# Patient Record
Sex: Female | Born: 1950 | Race: White | Hispanic: No | Smoking: Never smoker
Health system: Southern US, Community
[De-identification: ages and names within clinical notes are randomized; demographics above are authoritative.]

## PROBLEM LIST (undated history)

## (undated) DIAGNOSIS — IMO0002 Reserved for concepts with insufficient information to code with codable children: Secondary | ICD-10-CM

## (undated) DIAGNOSIS — M329 Systemic lupus erythematosus, unspecified: Secondary | ICD-10-CM

## (undated) DIAGNOSIS — E785 Hyperlipidemia, unspecified: Secondary | ICD-10-CM

## (undated) DIAGNOSIS — E0590 Thyrotoxicosis, unspecified without thyrotoxic crisis or storm: Secondary | ICD-10-CM

## (undated) DIAGNOSIS — M797 Fibromyalgia: Secondary | ICD-10-CM

## (undated) DIAGNOSIS — E669 Obesity, unspecified: Secondary | ICD-10-CM

## (undated) DIAGNOSIS — M1990 Unspecified osteoarthritis, unspecified site: Secondary | ICD-10-CM

## (undated) DIAGNOSIS — E559 Vitamin D deficiency, unspecified: Secondary | ICD-10-CM

## (undated) DIAGNOSIS — G894 Chronic pain syndrome: Secondary | ICD-10-CM

## (undated) HISTORY — DX: Thyrotoxicosis, unspecified without thyrotoxic crisis or storm: E05.90

## (undated) HISTORY — PX: CHOLECYSTECTOMY: SHX55

## (undated) HISTORY — DX: Reserved for concepts with insufficient information to code with codable children: IMO0002

## (undated) HISTORY — DX: Vitamin D deficiency, unspecified: E55.9

## (undated) HISTORY — DX: Hyperlipidemia, unspecified: E78.5

## (undated) HISTORY — DX: Chronic pain syndrome: G89.4

## (undated) HISTORY — DX: Obesity, unspecified: E66.9

## (undated) HISTORY — PX: TONSILLECTOMY: SUR1361

## (undated) HISTORY — DX: Unspecified osteoarthritis, unspecified site: M19.90

## (undated) HISTORY — DX: Fibromyalgia: M79.7

## (undated) HISTORY — DX: Systemic lupus erythematosus, unspecified: M32.9

## (undated) HISTORY — PX: OTHER SURGICAL HISTORY: SHX169

---

## 2015-09-30 LAB — LIPID PANEL
Cholesterol: 192 mg/dL (ref 0–200)
HDL: 70 mg/dL (ref 35–70)
LDL Cholesterol: 105 mg/dL
Triglycerides: 84 mg/dL (ref 40–160)

## 2015-09-30 LAB — TSH: TSH: 0.1 u[IU]/mL — AB (ref <=5.90)

## 2015-12-08 ENCOUNTER — Encounter: Payer: Self-pay | Admitting: "Endocrinology

## 2015-12-08 ENCOUNTER — Ambulatory Visit (INDEPENDENT_AMBULATORY_CARE_PROVIDER_SITE_OTHER): Payer: Medicare HMO | Admitting: "Endocrinology

## 2015-12-08 VITALS — BP 141/81 | HR 98 | Ht 63.0 in | Wt 264.0 lb

## 2015-12-08 DIAGNOSIS — E0590 Thyrotoxicosis, unspecified without thyrotoxic crisis or storm: Secondary | ICD-10-CM

## 2015-12-08 LAB — TSH: TSH: 17.88 mIU/L — ABNORMAL HIGH

## 2015-12-08 LAB — T3, FREE: T3 FREE: 2.5 pg/mL (ref 2.3–4.2)

## 2015-12-08 LAB — T4, FREE: FREE T4: 0.6 ng/dL — AB (ref 0.8–1.8)
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
## 2015-12-09 LAB — THYROGLOBULIN ANTIBODY: Thyroglobulin Ab: 3 IU/mL — ABNORMAL HIGH (ref <2)

## 2015-12-09 LAB — THYROID PEROXIDASE ANTIBODY: Thyroperoxidase Ab SerPl-aCnc: 729 IU/mL — ABNORMAL HIGH (ref <9)

## 2015-12-16 ENCOUNTER — Ambulatory Visit (INDEPENDENT_AMBULATORY_CARE_PROVIDER_SITE_OTHER): Payer: Medicare HMO | Admitting: "Endocrinology

## 2015-12-16 ENCOUNTER — Encounter: Payer: Self-pay | Admitting: "Endocrinology

## 2015-12-16 VITALS — BP 122/78 | HR 98 | Resp 18 | Ht 63.0 in | Wt 263.0 lb

## 2015-12-16 DIAGNOSIS — E039 Hypothyroidism, unspecified: Secondary | ICD-10-CM | POA: Insufficient documentation

## 2015-12-16 DIAGNOSIS — E038 Other specified hypothyroidism: Secondary | ICD-10-CM | POA: Diagnosis not present

## 2015-12-16 MED ORDER — LEVOTHYROXINE SODIUM 75 MCG PO TABS: 75.0000 ug | ORAL_TABLET | Freq: Every day | ORAL | 2 refills | Status: DC
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
## 2016-03-14 ENCOUNTER — Other Ambulatory Visit: Payer: Self-pay | Admitting: "Endocrinology

## 2016-03-15 LAB — T4, FREE: Free T4: 1 ng/dL (ref 0.8–1.8)

## 2016-03-15 LAB — TSH: TSH: 3.88 mIU/L

## 2016-03-21 ENCOUNTER — Encounter: Payer: Self-pay | Admitting: "Endocrinology

## 2016-03-21 ENCOUNTER — Ambulatory Visit (INDEPENDENT_AMBULATORY_CARE_PROVIDER_SITE_OTHER): Payer: Medicare HMO | Admitting: "Endocrinology

## 2016-03-21 VITALS — BP 118/83 | HR 108 | Ht 63.0 in | Wt 251.0 lb

## 2016-03-21 DIAGNOSIS — E038 Other specified hypothyroidism: Secondary | ICD-10-CM | POA: Diagnosis not present

## 2016-03-21 MED ORDER — LEVOTHYROXINE SODIUM 100 MCG PO TABS: 100.0000 ug | ORAL_TABLET | Freq: Every day | ORAL | 6 refills | Status: DC
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
## 2016-09-12 ENCOUNTER — Other Ambulatory Visit: Payer: Self-pay | Admitting: "Endocrinology

## 2016-09-12 LAB — T4, FREE: FREE T4: 1.2 ng/dL (ref 0.8–1.8)

## 2016-09-12 LAB — TSH: TSH: 0.84 m[IU]/L

## 2016-09-19 ENCOUNTER — Ambulatory Visit (INDEPENDENT_AMBULATORY_CARE_PROVIDER_SITE_OTHER): Payer: Medicare HMO | Admitting: "Endocrinology

## 2016-09-19 ENCOUNTER — Encounter: Payer: Self-pay | Admitting: "Endocrinology

## 2016-09-19 VITALS — BP 138/82 | HR 80 | Ht 63.0 in | Wt 266.0 lb

## 2016-09-19 DIAGNOSIS — E063 Autoimmune thyroiditis: Secondary | ICD-10-CM | POA: Diagnosis not present

## 2016-09-19 DIAGNOSIS — E038 Other specified hypothyroidism: Secondary | ICD-10-CM | POA: Diagnosis not present

## 2016-09-19 MED ORDER — LEVOTHYROXINE SODIUM 100 MCG PO TABS: 100.0000 ug | ORAL_TABLET | Freq: Every day | ORAL | 6 refills | Status: DC
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
## 2017-03-14 LAB — TSH: TSH: 1.1 mIU/L (ref 0.40–4.50)

## 2017-03-14 LAB — T4, FREE: FREE T4: 1.2 ng/dL (ref 0.8–1.8)

## 2017-03-21 ENCOUNTER — Encounter: Payer: Self-pay | Admitting: "Endocrinology

## 2017-03-21 ENCOUNTER — Ambulatory Visit (INDEPENDENT_AMBULATORY_CARE_PROVIDER_SITE_OTHER): Payer: Medicare Other | Admitting: "Endocrinology

## 2017-03-21 VITALS — BP 132/86 | HR 83 | Ht 63.0 in | Wt 281.0 lb

## 2017-03-21 DIAGNOSIS — E038 Other specified hypothyroidism: Secondary | ICD-10-CM

## 2017-03-21 DIAGNOSIS — E063 Autoimmune thyroiditis: Secondary | ICD-10-CM | POA: Insufficient documentation

## 2017-03-21 MED ORDER — LEVOTHYROXINE SODIUM 112 MCG PO TABS: 112.0000 ug | ORAL_TABLET | Freq: Every day | ORAL | 6 refills | Status: DC
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
## 2017-09-12 LAB — T4, FREE: FREE T4: 1.3 ng/dL (ref 0.8–1.8)

## 2017-09-12 LAB — TSH: TSH: 1.37 mIU/L (ref 0.40–4.50)

## 2017-09-19 ENCOUNTER — Ambulatory Visit (INDEPENDENT_AMBULATORY_CARE_PROVIDER_SITE_OTHER): Payer: Medicare Other | Admitting: "Endocrinology

## 2017-09-19 ENCOUNTER — Encounter: Payer: Self-pay | Admitting: "Endocrinology

## 2017-09-19 VITALS — BP 132/80 | HR 96 | Ht 63.0 in | Wt 276.0 lb

## 2017-09-19 DIAGNOSIS — E063 Autoimmune thyroiditis: Secondary | ICD-10-CM

## 2017-09-19 DIAGNOSIS — E038 Other specified hypothyroidism: Secondary | ICD-10-CM

## 2017-09-19 MED ORDER — LEVOTHYROXINE SODIUM 125 MCG PO TABS
125.0000 ug | ORAL_TABLET | Freq: Every day | ORAL | 6 refills | Status: DC
Start: 2017-09-19 — End: 2018-03-21
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
## 2018-03-14 LAB — T4, FREE: FREE T4: 1.2 ng/dL (ref 0.8–1.8)

## 2018-03-14 LAB — TSH: TSH: 2.04 m[IU]/L (ref 0.40–4.50)

## 2018-03-21 ENCOUNTER — Encounter: Payer: Self-pay | Admitting: "Endocrinology

## 2018-03-21 ENCOUNTER — Ambulatory Visit (INDEPENDENT_AMBULATORY_CARE_PROVIDER_SITE_OTHER): Payer: Medicare Other | Admitting: "Endocrinology

## 2018-03-21 VITALS — BP 126/85 | HR 101 | Ht 63.0 in | Wt 281.0 lb

## 2018-03-21 DIAGNOSIS — E038 Other specified hypothyroidism: Secondary | ICD-10-CM | POA: Diagnosis not present

## 2018-03-21 DIAGNOSIS — E063 Autoimmune thyroiditis: Secondary | ICD-10-CM | POA: Diagnosis not present

## 2018-03-21 MED ORDER — LEVOTHYROXINE SODIUM 137 MCG PO TABS: 137.0000 ug | ORAL_TABLET | Freq: Every day | ORAL | 6 refills | Status: DC
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
## 2018-09-12 LAB — TSH: TSH: 0.07 mIU/L — ABNORMAL LOW (ref 0.40–4.50)

## 2018-09-12 LAB — T4, FREE: Free T4: 1.3 ng/dL (ref 0.8–1.8)

## 2018-09-14 ENCOUNTER — Ambulatory Visit (HOSPITAL_COMMUNITY)
Admission: RE | Admit: 2018-09-14 | Discharge: 2018-09-14 | Disposition: A | Discharge status: Home / Self Care | Payer: Medicare Other | Source: Ambulatory Visit | Attending: "Endocrinology | Admitting: "Endocrinology

## 2018-09-14 ENCOUNTER — Other Ambulatory Visit: Payer: Self-pay

## 2018-09-14 DIAGNOSIS — E063 Autoimmune thyroiditis: Secondary | ICD-10-CM | POA: Insufficient documentation

## 2018-09-14 DIAGNOSIS — E038 Other specified hypothyroidism: Secondary | ICD-10-CM

## 2018-09-19 ENCOUNTER — Ambulatory Visit (INDEPENDENT_AMBULATORY_CARE_PROVIDER_SITE_OTHER): Payer: Medicare Other | Admitting: "Endocrinology

## 2018-09-19 ENCOUNTER — Encounter: Payer: Self-pay | Admitting: "Endocrinology

## 2018-09-19 ENCOUNTER — Other Ambulatory Visit: Payer: Self-pay

## 2018-09-19 DIAGNOSIS — E038 Other specified hypothyroidism: Secondary | ICD-10-CM

## 2018-09-19 DIAGNOSIS — E042 Nontoxic multinodular goiter: Secondary | ICD-10-CM

## 2018-09-19 DIAGNOSIS — E063 Autoimmune thyroiditis: Secondary | ICD-10-CM | POA: Diagnosis not present

## 2018-09-19 MED ORDER — LEVOTHYROXINE SODIUM 125 MCG PO TABS: 125.0000 ug | ORAL_TABLET | Freq: Every day | ORAL | 6 refills | Status: DC

## 2018-09-19 NOTE — Progress Notes (Signed)
09/19/2018                                Endocrinology Telehealth Visit Follow up Note -During COVID -19 Pandemic  I connected with April Blair on 09/19/2018   by telephone and verified that I am speaking with the correct person using two identifiers. April Blair, 1950-12-30. she has verbally consented to this visit. All issues noted in this document were discussed and addressed. The format was not optimal for physical exam.     Subjective:    Patient ID: April Blair, female    DOB: 07-05-1950, PCP April Jester, DO   Past Medical History:  Diagnosis Date  . Chronic pain disorder   . DJD (degenerative joint disease)   . Fibromyalgia   . Hyperlipidemia   . Hyperthyroidism   . Lupus (HCC)   . Obesity   . Vitamin D deficiency    Past Surgical History:  Procedure Laterality Date  . CESAREAN SECTION    . CHOLECYSTECTOMY    . OTHER SURGICAL HISTORY     R Ankle  . TONSILLECTOMY     Social History   Socioeconomic History  . Marital status: Divorced    Spouse name: Not on file  . Number of children: Not on file  . Years of education: Not on file  . Highest education level: Not on file  Occupational History  . Not on file  Social Needs  . Financial resource strain: Not on file  . Food insecurity:    Worry: Not on file    Inability: Not on file  . Transportation needs:    Medical: Not on file    Non-medical: Not on file  Tobacco Use  . Smoking status: Never Smoker  . Smokeless tobacco: Never Used  Substance and Sexual Activity  . Alcohol use: No  . Drug use: No  . Sexual activity: Not on file  Lifestyle  . Physical activity:    Days per week: Not on file    Minutes per session: Not on file  . Stress: Not on file  Relationships  . Social connections:    Talks on phone: Not on file    Gets together: Not on file    Attends religious service: Not on file    Active member of club or organization: Not on file    Attends meetings of clubs or  organizations: Not on file    Relationship status: Not on file  Other Topics Concern  . Not on file  Social History Narrative  . Not on file   Outpatient Encounter Medications as of 09/19/2018  Medication Sig  . buPROPion (WELLBUTRIN SR) 150 MG 12 hr tablet daily.  Marland Kitchen gabapentin (NEURONTIN) 400 MG capsule Take 400 mg by mouth 3 (three) times daily.  Marland Kitchen HYDROcodone-acetaminophen (NORCO) 10-325 MG tablet 4 (four) times daily.  Marland Kitchen levothyroxine (SYNTHROID) 125 MCG tablet Take 1 tablet (125 mcg total) by mouth daily before breakfast.  . linaclotide (LINZESS) 290 MCG CAPS capsule Take 290 mcg by mouth daily before breakfast.  . lisinopril (PRINIVIL,ZESTRIL) 10 MG tablet Take 10 mg by mouth daily.  . phentermine (ADIPEX-P) 37.5 MG tablet daily.  . traMADol (ULTRAM) 50 MG tablet Take by mouth every 6 (six) hours as needed.  . zolpidem (AMBIEN) 10 MG tablet Take 10 mg by mouth at bedtime.  . [DISCONTINUED] levothyroxine (SYNTHROID, LEVOTHROID) 137 MCG tablet Take 1 tablet (137 mcg  total) by mouth daily before breakfast.   No facility-administered encounter medications on file as of 09/19/2018.    ALLERGIES: No Known Allergies VACCINATION STATUS:  There is no immunization history on file for this patient.  HPI 68 year old female patient with medical history as above.   She is being engaged in telehealth in follow-up for hypothyroidism due to Hashimoto's thyroiditis. -She is currently on levothyroxine 137 mcg p.o. nightly. She reports compliance. She ports on and off palpitations, heat intolerance.    -She denies family history of thyroid dysfunction. She denies personal history of goiter. She denies family history of thyroid cancer. -She has 2 grown children none with thyroid problem. - Since her last visit, she was diagnosed with lupus. -Complains of on and off neck pain to the left side of the thyroid.  Her previsit thyroid ultrasound shows multinodular goiter.  Review of Systems Limited as  above.  Objective:    There were no vitals taken for this visit.  Wt Readings from Last 3 Encounters:  03/21/18 281 lb (127.5 kg)  09/19/17 276 lb (125.2 kg)  03/21/17 281 lb (127.5 kg)    Physical Exam    Recent Results (from the past 2160 hour(s))  TSH     Status: Abnormal   Collection Time: 09/12/18 11:20 AM  Result Value Ref Range   TSH 0.07 (L) 0.40 - 4.50 mIU/L  T4, free     Status: None   Collection Time: 09/12/18 11:20 AM  Result Value Ref Range   Free T4 1.3 0.8 - 1.8 ng/dL   September 14, 2018. Thyroid ultrasound showed 2 cm nodule on the isthmus with benign features and 1.4 cm nodule in the left lobe with benign features.  Assessment & Plan:   1. Hypothyroidism 2. Hashimoto's thyroiditis   - Her thyroid function tests are consistent with slight over replacement.  I discussed and lowered her levothyroxine to 125 mcg p.o. daily before breakfast.     - We discussed about the correct intake of her thyroid hormone, on empty stomach at fasting, with water, separated by at least 30 minutes from breakfast and other medications,  and separated by more than 4 hours from calcium, iron, multivitamins, acid reflux medications (PPIs). -Patient is made aware of the fact that thyroid hormone replacement is needed for life, dose to be adjusted by periodic monitoring of thyroid function tests.  I discussed the results of her thyroid/neck ultrasound from September 14, 2018.  She does have 2 nodules greater than 1.5 cm with benign features.  She does not require biopsy or intervention at this time.  However she will need another thyroid ultrasound in 1 year for follow-up.   - I advised patient to maintain close follow up with April JesterButler, Cynthia, DO for primary care needs.  Time for this visit 25 minutes. - Time spent with the patient: 25 min, of which >50% was spent in reviewing her  current and  previous labs/studies, previous treatments, and medications doses and developing a plan for  long-term care. April Blair participated in the discussions, expressed understanding, and voiced agreement with the above plans.  All questions were answered to her satisfaction. she is encouraged to contact clinic should she have any questions or concerns prior to her return visit.  Follow up plan: Return in about 6 months (around 03/21/2019) for Follow up with Pre-visit Labs.  Marquis LunchGebre Shaneca Orne, MD Phone: [phone]  Fax: [phone]  -  This note was partially dictated with voice recognition software. Similar sounding  words can be transcribed inadequately or may not  be corrected upon review.  09/19/2018, 4:09 PM

## 2019-03-15 LAB — TSH: TSH: 3.55 mIU/L (ref 0.40–4.50)

## 2019-03-15 LAB — T4, FREE: Free T4: 0.9 ng/dL (ref 0.8–1.8)

## 2019-03-21 ENCOUNTER — Other Ambulatory Visit: Payer: Self-pay

## 2019-03-21 ENCOUNTER — Encounter: Payer: Self-pay | Admitting: "Endocrinology

## 2019-03-21 ENCOUNTER — Ambulatory Visit (INDEPENDENT_AMBULATORY_CARE_PROVIDER_SITE_OTHER): Payer: Medicare Other | Admitting: "Endocrinology

## 2019-03-21 DIAGNOSIS — E063 Autoimmune thyroiditis: Secondary | ICD-10-CM

## 2019-03-21 DIAGNOSIS — E038 Other specified hypothyroidism: Secondary | ICD-10-CM

## 2019-03-21 MED ORDER — LEVOTHYROXINE SODIUM 137 MCG PO TABS: 137.0000 ug | ORAL_TABLET | Freq: Every day | ORAL | 4 refills | Status: DC

## 2019-03-21 NOTE — Progress Notes (Signed)
03/21/2019                                Endocrinology Telehealth Visit Follow up Note -During COVID -19 Pandemic  I connected with April Blair on 03/21/2019   by telephone and verified that I am speaking with the correct person using two identifiers. April Blair, [birth year]. she has verbally consented to this visit. All issues noted in this document were discussed and addressed. The format was not optimal for physical exam.     Subjective:    Patient ID: April Blair, female    DOB: 1950/11/20, PCP Hart Robinsons, DO   Past Medical History:  Diagnosis Date  . Chronic pain disorder   . DJD (degenerative joint disease)   . Fibromyalgia   . Hyperlipidemia   . Hyperthyroidism   . Lupus (HCC)   . Obesity   . Vitamin D deficiency    Past Surgical History:  Procedure Laterality Date  . CESAREAN SECTION    . CHOLECYSTECTOMY    . OTHER SURGICAL HISTORY     R Ankle  . TONSILLECTOMY     Social History   Socioeconomic History  . Marital status: Divorced    Spouse name: Not on file  . Number of children: Not on file  . Years of education: Not on file  . Highest education level: Not on file  Occupational History  . Not on file  Social Needs  . Financial resource strain: Not on file  . Food insecurity    Worry: Not on file    Inability: Not on file  . Transportation needs    Medical: Not on file    Non-medical: Not on file  Tobacco Use  . Smoking status: Never Smoker  . Smokeless tobacco: Never Used  Substance and Sexual Activity  . Alcohol use: No  . Drug use: No  . Sexual activity: Not on file  Lifestyle  . Physical activity    Days per week: Not on file    Minutes per session: Not on file  . Stress: Not on file  Relationships  . Social Musician on phone: Not on file    Gets together: Not on file    Attends religious service: Not on file    Active member of club or organization: Not on file    Attends meetings of clubs or  organizations: Not on file    Relationship status: Not on file  Other Topics Concern  . Not on file  Social History Narrative  . Not on file   Outpatient Encounter Medications as of 03/21/2019  Medication Sig  . buPROPion (WELLBUTRIN SR) 150 MG 12 hr tablet daily.  Marland Kitchen gabapentin (NEURONTIN) 400 MG capsule Take 400 mg by mouth 3 (three) times daily.  Marland Kitchen HYDROcodone-acetaminophen (NORCO) 10-325 MG tablet 4 (four) times daily.  Marland Kitchen levothyroxine (SYNTHROID) 137 MCG tablet Take 1 tablet (137 mcg total) by mouth daily before breakfast.  . linaclotide (LINZESS) 290 MCG CAPS capsule Take 290 mcg by mouth daily before breakfast.  . lisinopril (PRINIVIL,ZESTRIL) 10 MG tablet Take 10 mg by mouth daily.  . phentermine (ADIPEX-P) 37.5 MG tablet daily.  . traMADol (ULTRAM) 50 MG tablet Take by mouth every 6 (six) hours as needed.  . zolpidem (AMBIEN) 10 MG tablet Take 10 mg by mouth at bedtime.  . [DISCONTINUED] levothyroxine (SYNTHROID) 125 MCG tablet Take 1 tablet (125 mcg  total) by mouth daily before breakfast.   No facility-administered encounter medications on file as of 03/21/2019.    ALLERGIES: No Known Allergies VACCINATION STATUS:  There is no immunization history on file for this patient.  HPI 68 year old female patient with medical history as above.   She is being engaged in telehealth in follow-up for hypothyroidism due to Hashimoto's thyroiditis. -She is currently on levothyroxine 125 mcg p.o. nightly. She reports compliance. She has no new complaints today.  -She denies family history of thyroid dysfunction. She denies personal history of goiter. She denies family history of thyroid cancer. -She has 2 grown children none with thyroid problem. - Since her last visit, she was diagnosed with lupus. -Complains of on and off neck pain to the left side of the thyroid.  Her previsit thyroid ultrasound shows multinodular goiter.  Review of Systems Limited as above.  Objective:    There  were no vitals taken for this visit.  Wt Readings from Last 3 Encounters:  03/21/18 281 lb (127.5 kg)  09/19/17 276 lb (125.2 kg)  03/21/17 281 lb (127.5 kg)    Physical Exam    Recent Results (from the past 2160 hour(s))  T4, free     Status: None   Collection Time: 03/14/19  2:44 PM  Result Value Ref Range   Free T4 0.9 0.8 - 1.8 ng/dL  TSH     Status: None   Collection Time: 03/14/19  2:44 PM  Result Value Ref Range   TSH 3.55 0.40 - 4.50 mIU/L   September 14, 2018. Thyroid ultrasound showed 2 cm nodule on the isthmus with benign features and 1.4 cm nodule in the left lobe with benign features.  Assessment & Plan:   1. Hypothyroidism 2. Hashimoto's thyroiditis   - Her thyroid function tests are consistent with underreplacement.  I discussed and increased her levothyroxine to 137 mcg p.o. daily before breakfast.     - We discussed about the correct intake of her thyroid hormone, on empty stomach at fasting, with water, separated by at least 30 minutes from breakfast and other medications,  and separated by more than 4 hours from calcium, iron, multivitamins, acid reflux medications (PPIs). -Patient is made aware of the fact that thyroid hormone replacement is needed for life, dose to be adjusted by periodic monitoring of thyroid function tests.   I discussed the results of her thyroid/neck ultrasound from September 14, 2018.  She does have 2 nodules greater than 1.5 cm with benign features.  She does not require biopsy or intervention at this time.  However she will need another thyroid ultrasound in 1 year for follow-up.   - I advised patient to maintain close follow up with Scotty Court, DO for primary care needs.   Time for this visit: 15 minutes. [name]  participated in the discussions, expressed understanding, and voiced agreement with the above plans.  All questions were answered to her satisfaction. she is encouraged to contact clinic should she have any  questions or concerns prior to her return visit.   Follow up plan: Return in about 4 months (around 07/21/2019) for Follow up with Pre-visit Labs.  Glade Lloyd, MD Phone: [phone]  Fax: [phone]  -  This note was partially dictated with voice recognition software. Similar sounding words can be transcribed inadequately or may not  be corrected upon review.  03/21/2019, 7:37 PM

## 2019-04-16 ENCOUNTER — Other Ambulatory Visit: Payer: Self-pay

## 2019-04-16 MED ORDER — LEVOTHYROXINE SODIUM 137 MCG PO TABS: 137.0000 ug | ORAL_TABLET | Freq: Every day | ORAL | 4 refills | Status: DC

## 2019-05-02 ENCOUNTER — Other Ambulatory Visit (HOSPITAL_COMMUNITY): Payer: Self-pay | Admitting: Family

## 2019-05-07 ENCOUNTER — Other Ambulatory Visit (HOSPITAL_COMMUNITY): Payer: Self-pay | Admitting: Family

## 2019-05-07 DIAGNOSIS — N6489 Other specified disorders of breast: Secondary | ICD-10-CM

## 2019-05-14 ENCOUNTER — Other Ambulatory Visit: Payer: Self-pay

## 2019-05-14 ENCOUNTER — Ambulatory Visit (HOSPITAL_COMMUNITY)
Admission: RE | Admit: 2019-05-14 | Discharge: 2019-05-14 | Disposition: A | Discharge status: Home / Self Care | Payer: Medicare Other | Source: Ambulatory Visit | Attending: Family | Admitting: Family

## 2019-05-14 ENCOUNTER — Ambulatory Visit (HOSPITAL_COMMUNITY): Admission: RE | Admit: 2019-05-14 | Payer: Medicare Other | Source: Ambulatory Visit

## 2019-05-14 ENCOUNTER — Encounter (HOSPITAL_COMMUNITY): Payer: Self-pay

## 2019-05-14 DIAGNOSIS — N6489 Other specified disorders of breast: Secondary | ICD-10-CM | POA: Insufficient documentation

## 2019-06-18 ENCOUNTER — Encounter (HOSPITAL_COMMUNITY): Payer: Medicare Other

## 2019-06-18 ENCOUNTER — Other Ambulatory Visit (HOSPITAL_COMMUNITY): Payer: Self-pay | Admitting: Family

## 2019-06-18 ENCOUNTER — Ambulatory Visit (HOSPITAL_COMMUNITY)
Admission: RE | Admit: 2019-06-18 | Discharge: 2019-06-18 | Disposition: A | Discharge status: Home / Self Care | Payer: Medicare Other | Source: Ambulatory Visit | Attending: Family | Admitting: Family

## 2019-06-18 ENCOUNTER — Other Ambulatory Visit: Payer: Self-pay

## 2019-06-18 DIAGNOSIS — N6489 Other specified disorders of breast: Secondary | ICD-10-CM

## 2019-06-25 ENCOUNTER — Ambulatory Visit (HOSPITAL_COMMUNITY)
Admission: RE | Admit: 2019-06-25 | Discharge: 2019-06-25 | Disposition: A | Discharge status: Home / Self Care | Payer: Medicare Other | Source: Ambulatory Visit | Attending: Family | Admitting: Family

## 2019-06-25 ENCOUNTER — Other Ambulatory Visit: Payer: Self-pay

## 2019-06-25 ENCOUNTER — Ambulatory Visit (HOSPITAL_COMMUNITY)
Admission: RE | Admit: 2019-06-25 | Discharge: 2019-06-25 | Disposition: A | Discharge status: Home / Self Care | Payer: Medicare Other | Source: Ambulatory Visit | Attending: *Deleted | Admitting: *Deleted

## 2019-06-25 ENCOUNTER — Other Ambulatory Visit (HOSPITAL_COMMUNITY): Payer: Self-pay | Admitting: *Deleted

## 2019-06-25 DIAGNOSIS — R928 Other abnormal and inconclusive findings on diagnostic imaging of breast: Secondary | ICD-10-CM | POA: Diagnosis present

## 2019-06-25 DIAGNOSIS — N6489 Other specified disorders of breast: Secondary | ICD-10-CM | POA: Insufficient documentation

## 2019-06-25 MED ORDER — LIDOCAINE HCL (PF) 2 % IJ SOLN
INTRAMUSCULAR | Status: AC
  Filled 2019-06-25: qty 10

## 2019-06-25 MED ORDER — SODIUM BICARBONATE 4.2 % IV SOLN
INTRAVENOUS | Status: AC
  Filled 2019-06-25: qty 10

## 2019-06-25 MED ORDER — LIDOCAINE-EPINEPHRINE (PF) 1 %-1:200000 IJ SOLN
INTRAMUSCULAR | Status: AC
  Filled 2019-06-25: qty 30

## 2019-06-26 LAB — SURGICAL PATHOLOGY

## 2019-07-16 LAB — T4, FREE: Free T4: 1.2 ng/dL (ref 0.8–1.8)

## 2019-07-16 LAB — TSH: TSH: 0.05 mIU/L — ABNORMAL LOW (ref 0.40–4.50)

## 2019-07-23 ENCOUNTER — Encounter: Payer: Self-pay | Admitting: "Endocrinology

## 2019-07-23 ENCOUNTER — Ambulatory Visit (INDEPENDENT_AMBULATORY_CARE_PROVIDER_SITE_OTHER): Payer: Medicare Other | Admitting: "Endocrinology

## 2019-07-23 DIAGNOSIS — E042 Nontoxic multinodular goiter: Secondary | ICD-10-CM

## 2019-07-23 DIAGNOSIS — E038 Other specified hypothyroidism: Secondary | ICD-10-CM | POA: Diagnosis not present

## 2019-07-23 DIAGNOSIS — E063 Autoimmune thyroiditis: Secondary | ICD-10-CM | POA: Diagnosis not present

## 2019-07-23 MED ORDER — LEVOTHYROXINE SODIUM 125 MCG PO TABS: 125.0000 ug | ORAL_TABLET | Freq: Every day | ORAL | 3 refills | Status: DC

## 2019-07-23 NOTE — Progress Notes (Addendum)
07/23/2019                                          Endocrinology Telehealth Visit Follow up Note -During COVID -19 Pandemic  I connected with April Blair on 07/23/2019   by telephone and verified that I am speaking with the correct person using two identifiers. April Blair, 08-06-1950. she has verbally consented to this visit. All issues noted in this document were discussed and addressed. The format was not optimal for physical exam.   Subjective:    Patient ID: April Blair, female    DOB: [birth year], PCP Hart Robinsons, DO   Past Medical History:  Diagnosis Date  . Chronic pain disorder   . DJD (degenerative joint disease)   . Fibromyalgia   . Hyperlipidemia   . Hyperthyroidism   . Lupus (HCC)   . Obesity   . Vitamin D deficiency    Past Surgical History:  Procedure Laterality Date  . CESAREAN SECTION    . CHOLECYSTECTOMY    . OTHER SURGICAL HISTORY     R Ankle  . TONSILLECTOMY     Social History   Socioeconomic History  . Marital status: Divorced    Spouse name: Not on file  . Number of children: Not on file  . Years of education: Not on file  . Highest education level: Not on file  Occupational History  . Not on file  Tobacco Use  . Smoking status: Never Smoker  . Smokeless tobacco: Never Used  Substance and Sexual Activity  . Alcohol use: No  . Drug use: No  . Sexual activity: Not on file  Other Topics Concern  . Not on file  Social History Narrative  . Not on file   Social Determinants of Health   Financial Resource Strain:   . Difficulty of Paying Living Expenses: Not on file  Food Insecurity:   . Worried About Programme researcher, broadcasting/film/video in the Last Year: Not on file  . Ran Out of Food in the Last Year: Not on file  Transportation Needs:   . Lack of Transportation (Medical): Not on file  . Lack of Transportation (Non-Medical): Not on file  Physical Activity:   . Days of Exercise per Week: Not on file  . Minutes of Exercise per Session: Not  on file  Stress:   . Feeling of Stress : Not on file  Social Connections:   . Frequency of Communication with Friends and Family: Not on file  . Frequency of Social Gatherings with Friends and Family: Not on file  . Attends Religious Services: Not on file  . Active Member of Clubs or Organizations: Not on file  . Attends Banker Meetings: Not on file  . Marital Status: Not on file   Outpatient Encounter Medications as of 07/23/2019  Medication Sig  . buPROPion (WELLBUTRIN SR) 150 MG 12 hr tablet daily.  Marland Kitchen gabapentin (NEURONTIN) 400 MG capsule Take 400 mg by mouth 3 (three) times daily.  Marland Kitchen HYDROcodone-acetaminophen (NORCO) 10-325 MG tablet 4 (four) times daily.  Marland Kitchen levothyroxine (SYNTHROID) 125 MCG tablet Take 1 tablet (125 mcg total) by mouth daily before breakfast.  . linaclotide (LINZESS) 290 MCG CAPS capsule Take 290 mcg by mouth daily before breakfast.  . lisinopril (PRINIVIL,ZESTRIL) 10 MG tablet Take 10 mg by mouth daily.  . phentermine (ADIPEX-P) 37.5 MG tablet  daily.  . traMADol (ULTRAM) 50 MG tablet Take by mouth every 6 (six) hours as needed.  . zolpidem (AMBIEN) 10 MG tablet Take 10 mg by mouth at bedtime.  . [DISCONTINUED] levothyroxine (SYNTHROID) 137 MCG tablet Take 1 tablet (137 mcg total) by mouth daily before breakfast.   No facility-administered encounter medications on file as of 07/23/2019.   ALLERGIES: No Known Allergies VACCINATION STATUS:  There is no immunization history on file for this patient.  HPI 69 year old female patient with medical history as above.   She is being engaged in telehealth via telephone in follow-up for hypothyroidism due to Hashimoto's thyroiditis.  -She is currently on levothyroxine 137 mcg p.o. daily before breakfast.   she reports compliance. She has no new complaints today.  -She denies family history of thyroid dysfunction. She denies personal history of goiter. She denies family history of thyroid cancer. -She has 2  grown children none with thyroid problem. - Since her last visit, she was diagnosed with lupus. -Complains of on and off neck pain to the left side of the thyroid.  Her previsit thyroid ultrasound shows multinodular goiter.  Review of Systems Limited as above.  Objective:    There were no vitals taken for this visit.  Wt Readings from Last 3 Encounters:  03/21/18 281 lb (127.5 kg)  09/19/17 276 lb (125.2 kg)  03/21/17 281 lb (127.5 kg)    Physical Exam    Recent Results (from the past 2160 hour(s))  Surgical pathology     Status: None   Collection Time: 06/25/19  1:08 PM  Result Value Ref Range   SURGICAL PATHOLOGY      SURGICAL PATHOLOGY CASE: [id number] PATIENT: Olene Vigorito Surgical Pathology Report     Clinical History: mass - ? papilloma/FA. r/o malignancy     FINAL MICROSCOPIC DIAGNOSIS:  A. BREAST, 8:00, RIGHT, BIOPSY: - Benign lymph node.  See comment - No evidence of malignancy    COMMENT:  [facility] was notified on 06/26/2019.    GROSS DESCRIPTION:  The specimen is received in formalin labeled with the patient's name and right breast, and consists of 5 cores and core fragments of tan-yellow, hemorrhagic fibroadipose tissue ranging from 0.3 x 0.2 x 0.1 cm 1.4 x 0.2 x 0.1 cm.  The specimen is entirely submitted in 1 cassette. Time in formalin 1 PM on 06/25/2019.  Cold ischemic time less than 1 minute. Craig Staggers 06/26/2019)    Final Diagnosis performed by Jaquita Folds, MD.   Electronically signed 06/26/2019 Technical component performed at [facility] [address] [place], [place] [address].  Professional  component performed at [employer]. [facility], [place] [address], [place], [place] [address].  Immunohistochemistry Technical component (if applicable) was performed at [facility]. [address] [place], [place],  [address].   IMMUNOHISTOCHEMISTRY DISCLAIMER  (if applicable): Some of these immunohistochemical stains may have been developed and the performance characteristics determine by [facility]. Some may not have been cleared or approved by the U.S. Food and Drug Administration. The FDA has determined that such clearance or approval is not necessary. This test is used for clinical purposes. It should not be regarded as investigational or for research. This laboratory is certified under the [place] (CLIA-88) as qualified to perform high complexity clinical laboratory testing.  The controls stained appropriately.   T4, free     Status: None   Collection Time: 07/16/19  11:00 AM  Result Value Ref Range   Free T4 1.2 0.8 - 1.8 ng/dL  TSH     Status: Abnormal   Collection Time: 07/16/19 11:00 AM  Result Value Ref Range   TSH 0.05 (L) 0.40 - 4.50 mIU/L   September 14, 2018. Thyroid ultrasound showed 2 cm nodule on the isthmus with benign features and 1.4 cm nodule in the left lobe with benign features.  Assessment & Plan:   1. Hypothyroidism 2. Hashimoto's thyroiditis   - Her thyroid function tests are consistent with slight over replacement.  I discussed and lowered her levothyroxine to 125 mcg p.o. daily before breakfast.     - We discussed about the correct intake of her thyroid hormone, on empty stomach at fasting, with water, separated by at least 30 minutes from breakfast and other medications,  and separated by more than 4 hours from calcium, iron, multivitamins, acid reflux medications (PPIs). -Patient is made aware of the fact that thyroid hormone replacement is needed for life, dose to be adjusted by periodic monitoring of thyroid function tests.  I discussed the results of her thyroid/neck ultrasound from September 14, 2018.  She does have 2 nodules greater than 1.5 cm with benign features.  She does not require biopsy or intervention at this time.  However she will need another  thyroid ultrasound in 1 year for follow-up.   - I advised patient to maintain close follow up with Hart Robinsons, DO for primary care needs.     - Time spent on this patient care encounter:  20 minutes of which 50% was spent in  counseling and the rest reviewing  her current and  previous labs / studies and medications  doses and developing a plan for long term care. April Blair  participated in the discussions, expressed understanding, and voiced agreement with the above plans.  All questions were answered to her satisfaction. she is encouraged to contact clinic should she have any questions or concerns prior to her return visit.   Follow up plan: Return in about 3 months (around 10/23/2019) for Follow up with Pre-visit Labs.  Marquis Lunch, MD Phone: [phone]  Fax: [phone]  -  This note was partially dictated with voice recognition software. Similar sounding words can be transcribed inadequately or may not  be corrected upon review.  07/23/2019, 12:52 PM

## 2019-07-23 NOTE — Patient Instructions (Signed)

## 2019-10-18 LAB — TSH: TSH: 2.5 mIU/L (ref 0.40–4.50)

## 2019-10-18 LAB — T4, FREE: Free T4: 1.1 ng/dL (ref 0.8–1.8)

## 2019-10-24 ENCOUNTER — Other Ambulatory Visit: Payer: Self-pay

## 2019-10-24 ENCOUNTER — Ambulatory Visit (INDEPENDENT_AMBULATORY_CARE_PROVIDER_SITE_OTHER): Payer: Medicare Other | Admitting: "Endocrinology

## 2019-10-24 ENCOUNTER — Encounter: Payer: Self-pay | Admitting: "Endocrinology

## 2019-10-24 VITALS — BP 113/74 | HR 99 | Ht 63.0 in | Wt 306.2 lb

## 2019-10-24 DIAGNOSIS — E063 Autoimmune thyroiditis: Secondary | ICD-10-CM

## 2019-10-24 DIAGNOSIS — E038 Other specified hypothyroidism: Secondary | ICD-10-CM | POA: Diagnosis not present

## 2019-10-24 MED ORDER — LEVOTHYROXINE SODIUM 125 MCG PO TABS: 125.0000 ug | ORAL_TABLET | Freq: Every day | ORAL | 1 refills | Status: AC

## 2019-10-24 NOTE — Progress Notes (Signed)
10/24/2019              Endocrinology follow-up note  Subjective:    Patient ID: April Blair, female    DOB: 05-27-1950, PCP April Robinsons, DO   Past Medical History:  Diagnosis Date  . Chronic pain disorder   . DJD (degenerative joint disease)   . Fibromyalgia   . Hyperlipidemia   . Hyperthyroidism   . Lupus (HCC)   . Obesity   . Vitamin D deficiency    Past Surgical History:  Procedure Laterality Date  . CESAREAN SECTION    . CHOLECYSTECTOMY    . OTHER SURGICAL HISTORY     R Ankle  . TONSILLECTOMY     Social History   Socioeconomic History  . Marital status: Divorced    Spouse name: Not on file  . Number of children: Not on file  . Years of education: Not on file  . Highest education level: Not on file  Occupational History  . Not on file  Tobacco Use  . Smoking status: Never Smoker  . Smokeless tobacco: Never Used  Substance and Sexual Activity  . Alcohol use: No  . Drug use: No  . Sexual activity: Not on file  Other Topics Concern  . Not on file  Social History Narrative  . Not on file   Social Determinants of Health   Financial Resource Strain:   . Difficulty of Paying Living Expenses:   Food Insecurity:   . Worried About Programme researcher, broadcasting/film/video in the Last Year:   . Barista in the Last Year:   Transportation Needs:   . Freight forwarder (Medical):   Marland Kitchen Lack of Transportation (Non-Medical):   Physical Activity:   . Days of Exercise per Week:   . Minutes of Exercise per Session:   Stress:   . Feeling of Stress :   Social Connections:   . Frequency of Communication with Friends and Family:   . Frequency of Social Gatherings with Friends and Family:   . Attends Religious Services:   . Active Member of Clubs or Organizations:   . Attends Banker Meetings:   Marland Kitchen Marital Status:    Outpatient Encounter Medications as of 10/24/2019  Medication Sig  . buPROPion (WELLBUTRIN SR) 150 MG 12 hr tablet daily.  Marland Kitchen gabapentin  (NEURONTIN) 800 MG tablet Take 800 mg by mouth 4 (four) times daily.  Marland Kitchen HYDROcodone-acetaminophen (NORCO) 10-325 MG tablet 4 (four) times daily.  Marland Kitchen levothyroxine (SYNTHROID) 125 MCG tablet Take 1 tablet (125 mcg total) by mouth daily before breakfast.  . linaclotide (LINZESS) 290 MCG CAPS capsule Take 290 mcg by mouth daily before breakfast.  . lisinopril (PRINIVIL,ZESTRIL) 10 MG tablet Take 10 mg by mouth daily.  . phentermine (ADIPEX-P) 37.5 MG tablet daily.  . traMADol (ULTRAM) 50 MG tablet Take by mouth every 6 (six) hours as needed.  . zolpidem (AMBIEN) 10 MG tablet Take 10 mg by mouth at bedtime.  . [DISCONTINUED] gabapentin (NEURONTIN) 400 MG capsule Take 400 mg by mouth 3 (three) times daily.  . [DISCONTINUED] levothyroxine (SYNTHROID) 125 MCG tablet Take 1 tablet (125 mcg total) by mouth daily before breakfast.   No facility-administered encounter medications on file as of 10/24/2019.   ALLERGIES: No Known Allergies VACCINATION STATUS:  There is no immunization history on file for this patient.  HPI 69 year old female patient with medical history as above.   She is being seen in follow-up for management  of hypothyroidism due to Hashimoto's thyroiditis.   -She is currently on levothyroxine 125 mcg p.o. daily before breakfast.  She reports compliance with medication.  She has no new complaints.  She reports fluctuating body weight.   -She denies family history of thyroid dysfunction. She denies personal history of goiter. She denies family history of thyroid cancer. -She has 2 grown children none with thyroid problem. - Since her last visit, she was diagnosed with lupus. -Complains of on and off neck pain to the left side of the thyroid.  Her previsit thyroid ultrasound shows multinodular goiter.  Review of Systems Limited as above.  Objective:    BP 113/74   Pulse 99   Ht 5\' 3"  (1.6 m)   Wt (!) 306 lb 3.2 oz (138.9 kg)   BMI 54.24 kg/m   Wt Readings from Last 3 Encounters:   10/24/19 (!) 306 lb 3.2 oz (138.9 kg)  03/21/18 281 lb (127.5 kg)  09/19/17 276 lb (125.2 kg)    Physical Exam    Recent Results (from the past 2160 hour(s))  T4, free     Status: None   Collection Time: 10/17/19 12:12 PM  Result Value Ref Range   Free T4 1.1 0.8 - 1.8 ng/dL  TSH     Status: None   Collection Time: 10/17/19 12:12 PM  Result Value Ref Range   TSH 2.50 0.40 - 4.50 mIU/L   September 14, 2018. Thyroid ultrasound showed 2 cm nodule on the isthmus with benign features and 1.4 cm nodule in the left lobe with benign features.  Assessment & Plan:   1. Hypothyroidism 2. Hashimoto's thyroiditis 3.  Multinodular goiter   - Her thyroid function tests are consistent with appropriate replacement.  She is advised to continue levothyroxine 125 mg p.o. daily before breakfast.   - We discussed about the correct intake of her thyroid hormone, on empty stomach at fasting, with water, separated by at least 30 minutes from breakfast and other medications,  and separated by more than 4 hours from calcium, iron, multivitamins, acid reflux medications (PPIs). -Patient is made aware of the fact that thyroid hormone replacement is needed for life, dose to be adjusted by periodic monitoring of thyroid function tests.  I discussed the results of her thyroid/neck ultrasound from September 14, 2018.  She does have 2 nodules greater than 1.5 cm with benign features.  She does not require biopsy or intervention at this time.  However she will need another thyroid ultrasound in 1 year for follow-up.   - I advised patient to maintain close follow up with Scotty Court, DO for primary care needs.      - Time spent on this patient care encounter:  20 minutes of which 50% was spent in  counseling and the rest reviewing  her current and  previous labs / studies and medications  doses and developing a plan for long term care. [name]  participated in the discussions, expressed understanding, and  voiced agreement with the above plans.  All questions were answered to her satisfaction. she is encouraged to contact clinic should she have any questions or concerns prior to her return visit.    Follow up plan: Return in about 6 months (around 04/24/2020) for F/U with Pre-visit Labs.  Glade Lloyd, MD Phone: [phone]  Fax: [phone]  -  This note was partially dictated with voice recognition software. Similar sounding words can be transcribed inadequately or may not  be corrected upon review.  10/24/2019, 5:30 PM

## 2020-04-28 ENCOUNTER — Ambulatory Visit: Payer: Medicare Other | Admitting: Nurse Practitioner

## 2020-04-28 NOTE — Patient Instructions (Incomplete)

## 2021-08-04 IMAGING — MG MM BREAST LOCALIZATION CLIP
4 series · 4 of 12 positions shown · non-contrast
Comparison: Previous exam(s).

CLINICAL DATA: Post ultrasound-guided biopsy of a mass in the right
breast at the 8 o'clock position.

EXAM:
DIAGNOSTIC RIGHT MAMMOGRAM POST ULTRASOUND BIOPSY

[R ML synth-2D]
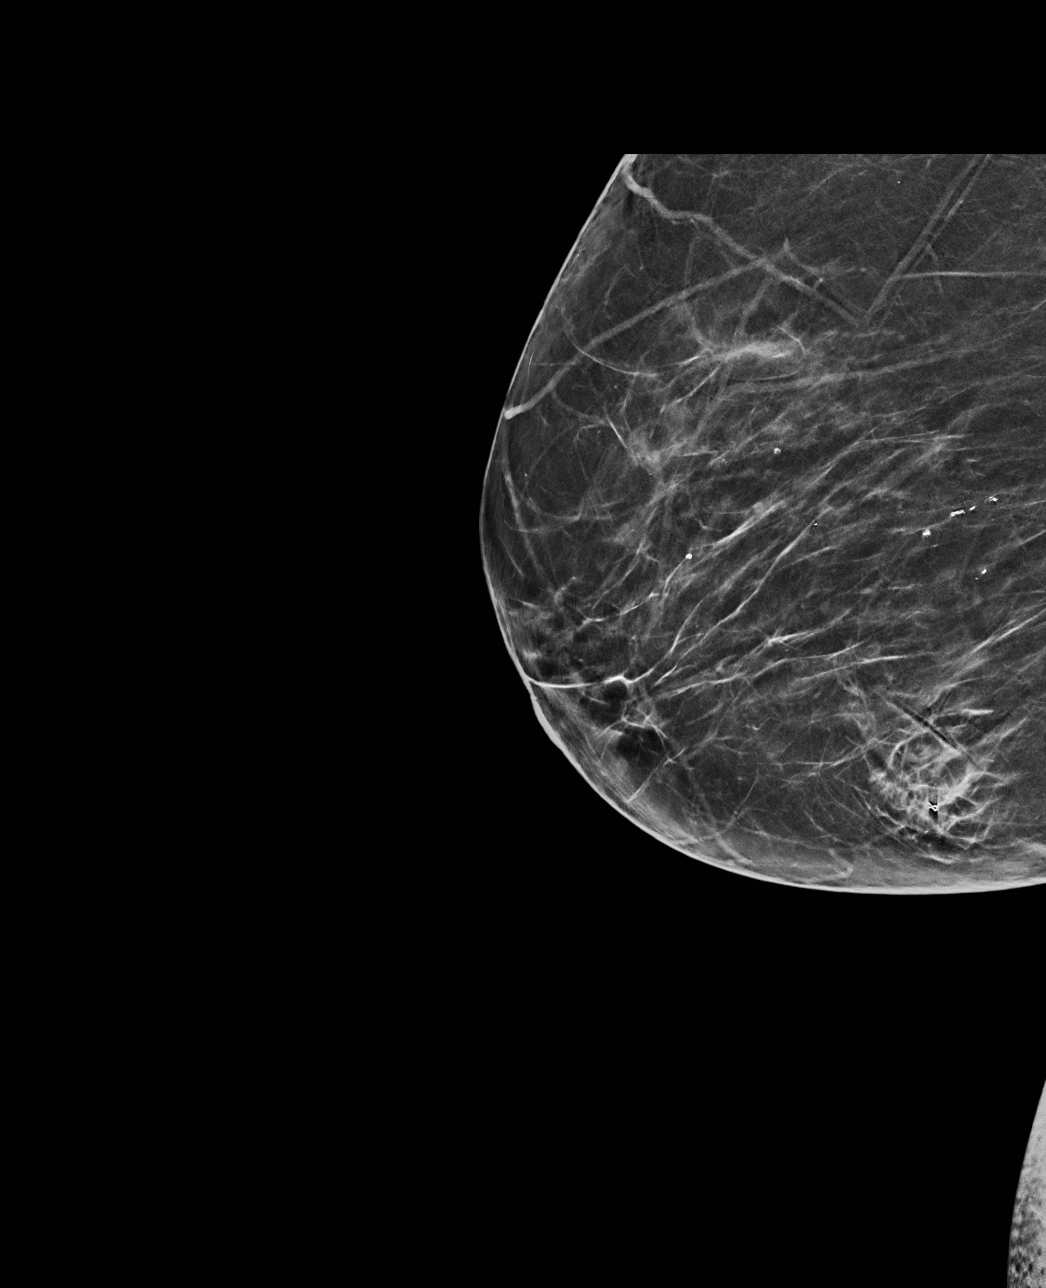

[R CC synth-2D]
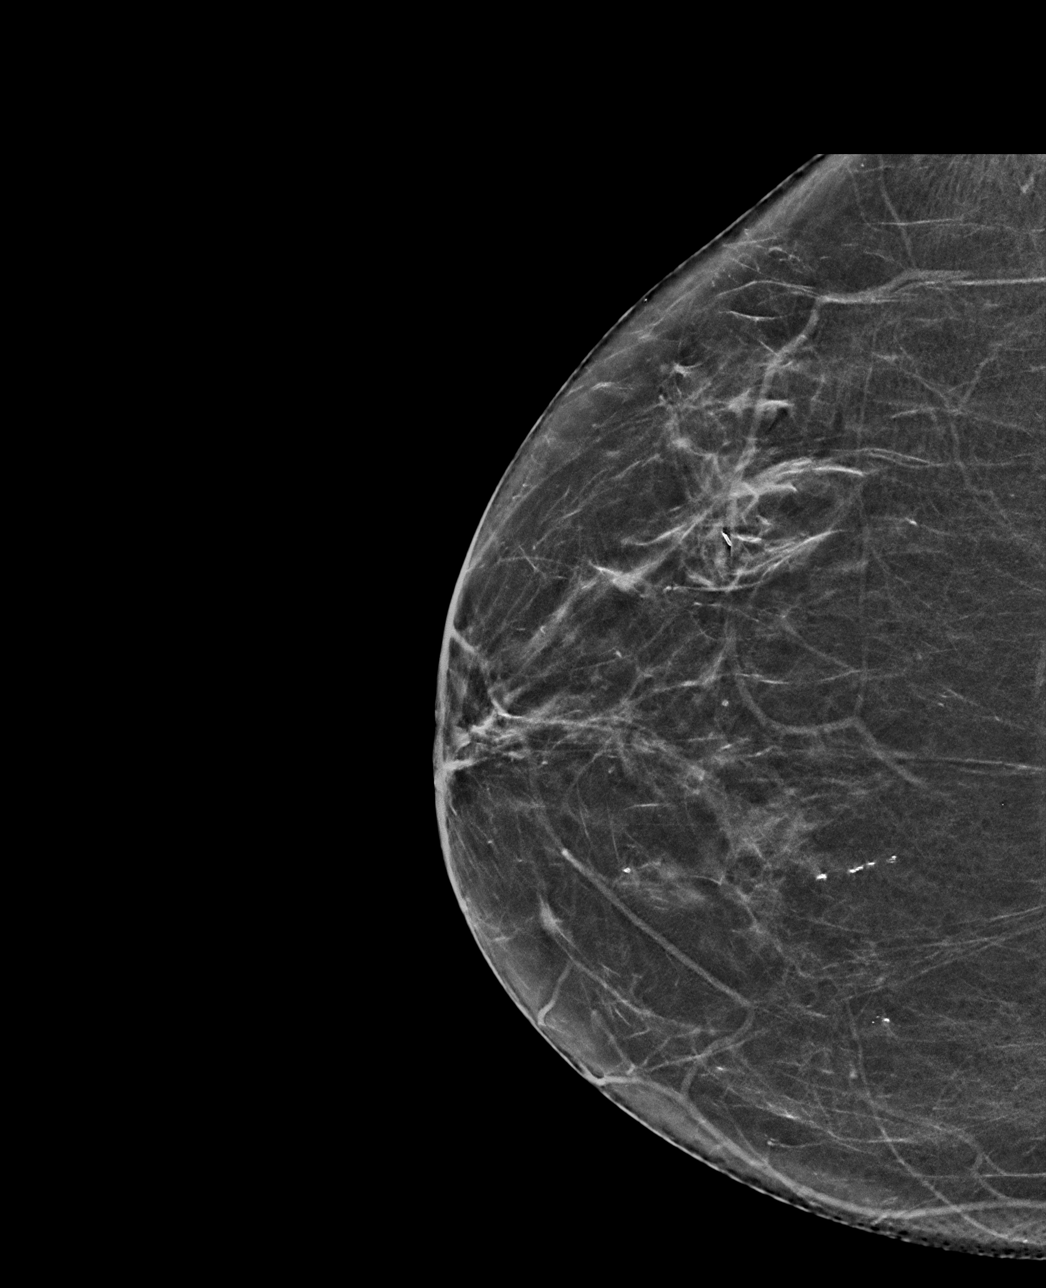

[R CC tomo · tomo slice 31/61.0]
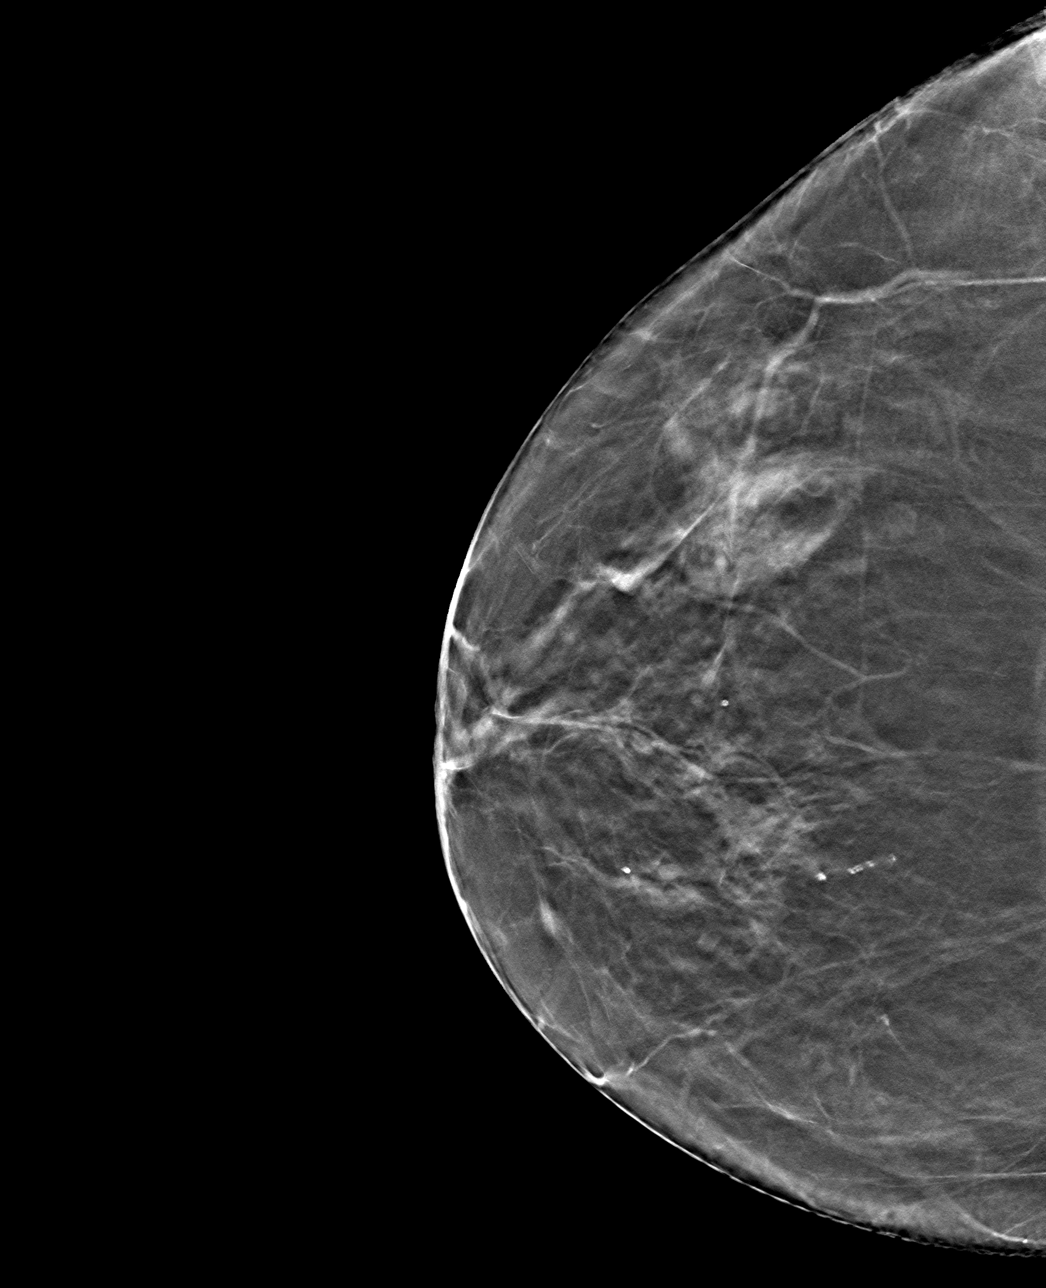

[R ML tomo · tomo slice 33/64.0]
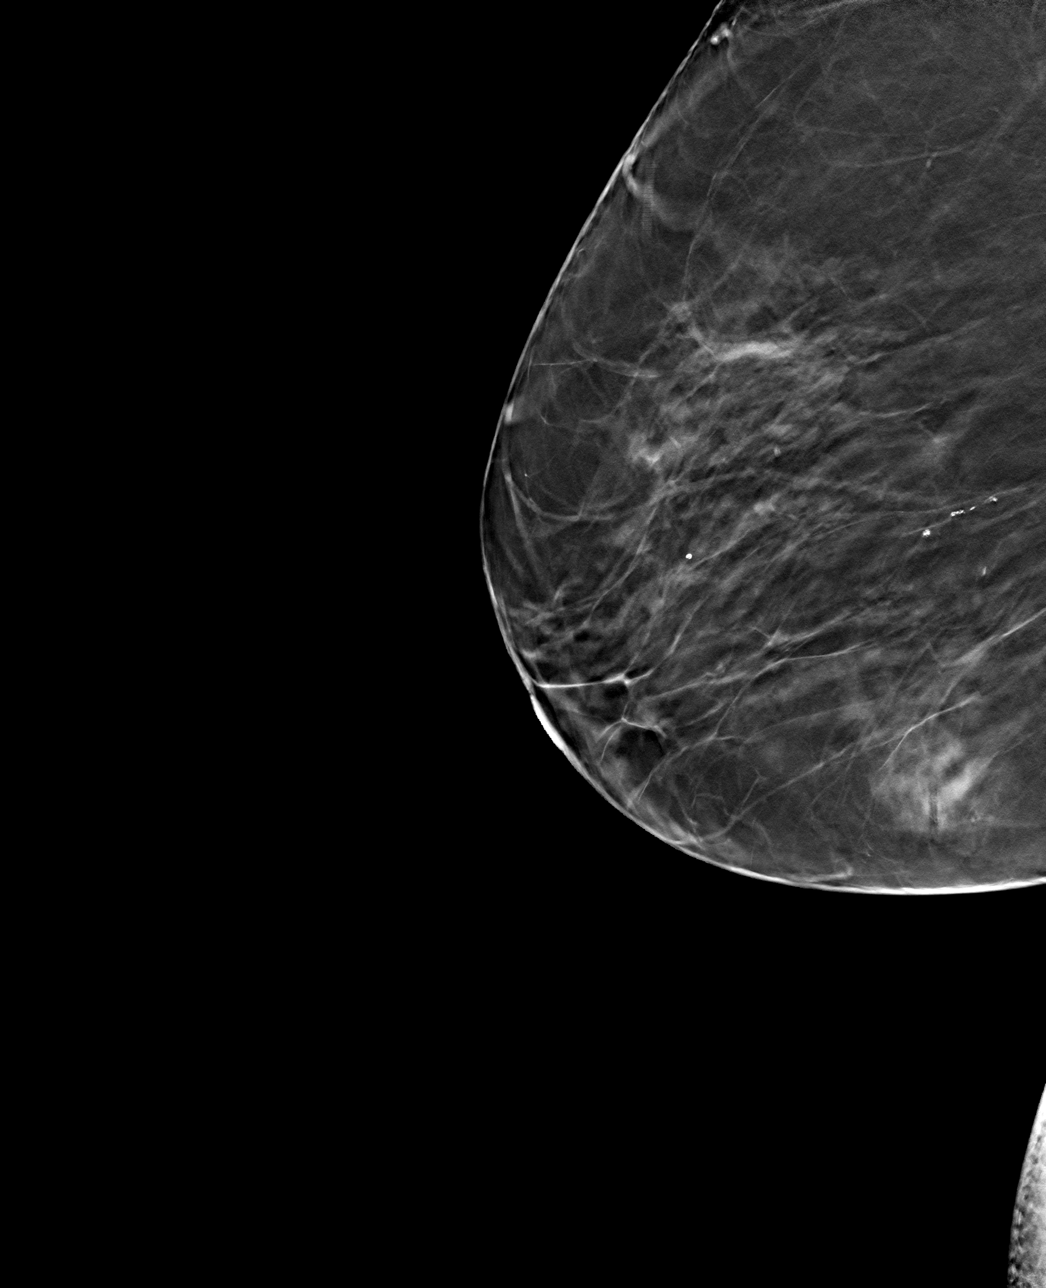

[4 of 12 positions shown; findings below may reference images not displayed]

FINDINGS: Mammographic images were obtained following ultrasound guided biopsy
of a mass in the right breast at the 8 position. The biopsy marking
clip is a ribbon shaped biopsy marking clip is present at the site
of the biopsied mass, although the mass cannot be identified on the
mammogram due to post biopsy hematoma this location.
IMPRESSION: Ribbon shaped biopsy marking clip at the site of the biopsied mass
in the right breast at the 8 o'clock position although the mass
cannot be adequately visualized due to post biopsy change in this
location.

Final Assessment: Post Procedure Mammograms for Marker Placement

## 2021-08-04 IMAGING — US US  BREAST BX W/ LOC DEV 1ST LESION IMG BX SPEC US GUIDE*R*
1 series · 12 of 23 positions shown · non-contrast
Comparison: Previous exam(s).
COMPARISON: Previous exam(s).

Addendum:
CLINICAL DATA: 60-year-old female with an indeterminate mass in the
right breast at the 8 o'clock position.

EXAM:
ULTRASOUND GUIDED RIGHT BREAST CORE NEEDLE BIOPSY

[Series 1: us breast bx w/ loc dev 1st lesion img bx spec us  · 0.07mm/px · 12 of 23 slices shown]
[im 1/23]
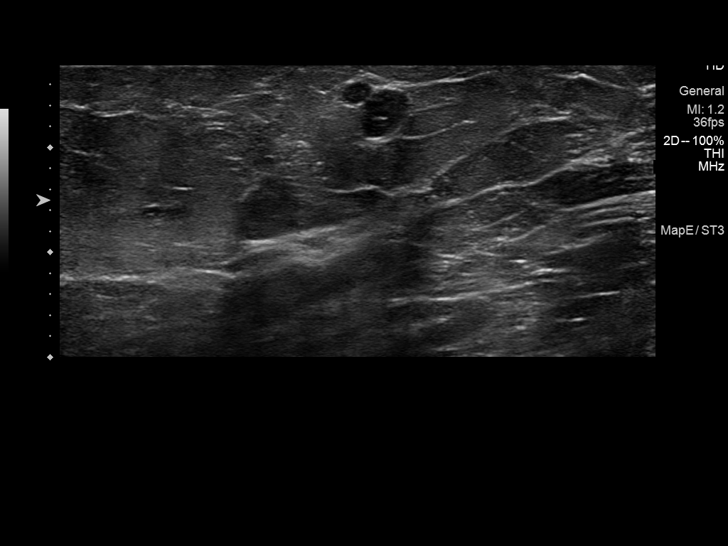
[im 3/23]
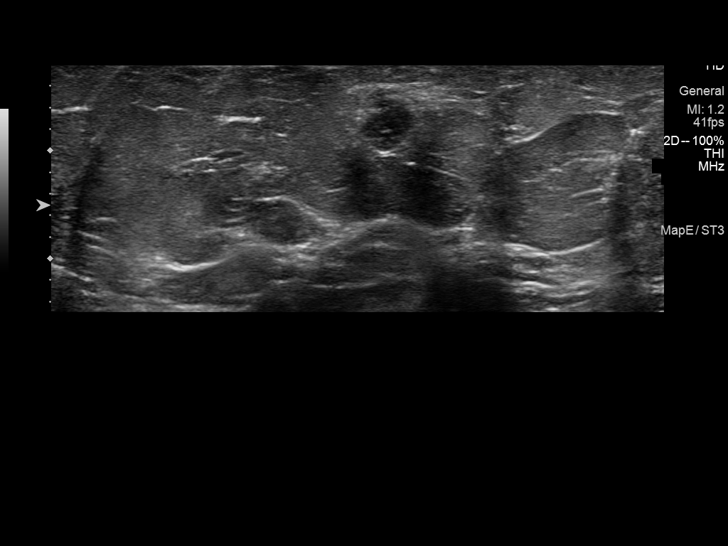
[im 5/23]
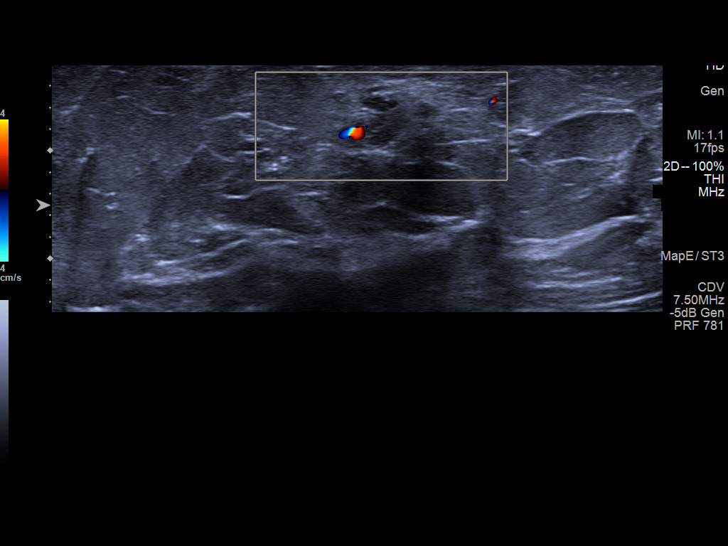
[im 7/23]
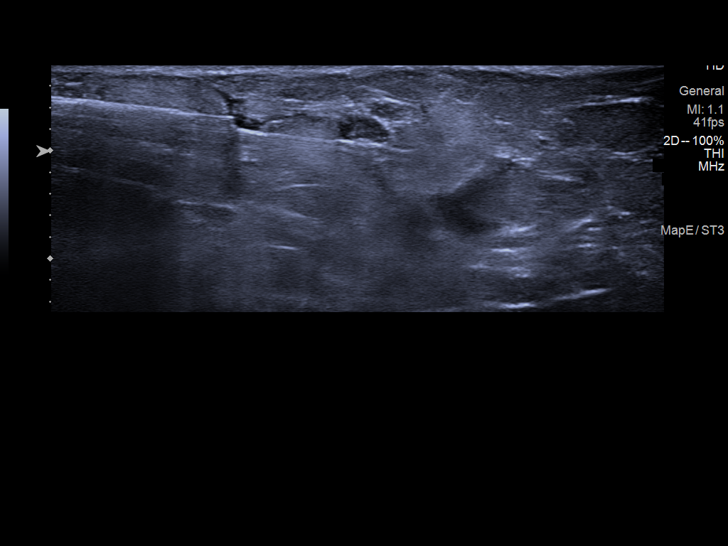
[im 9/23]
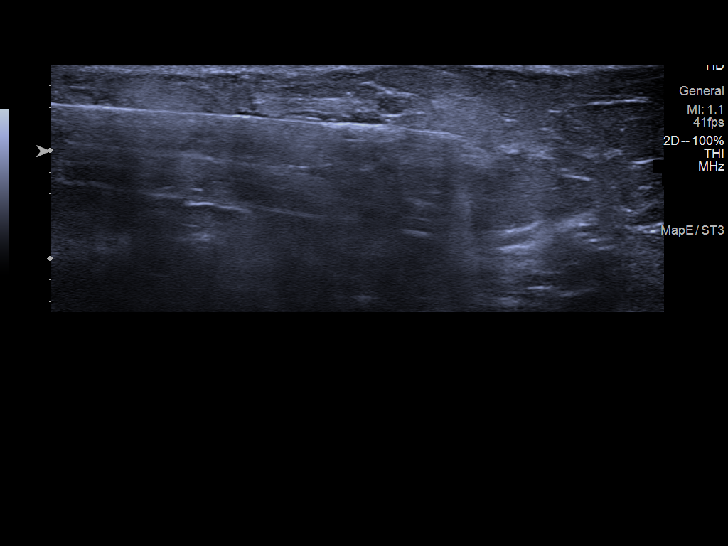
[im 11/23]
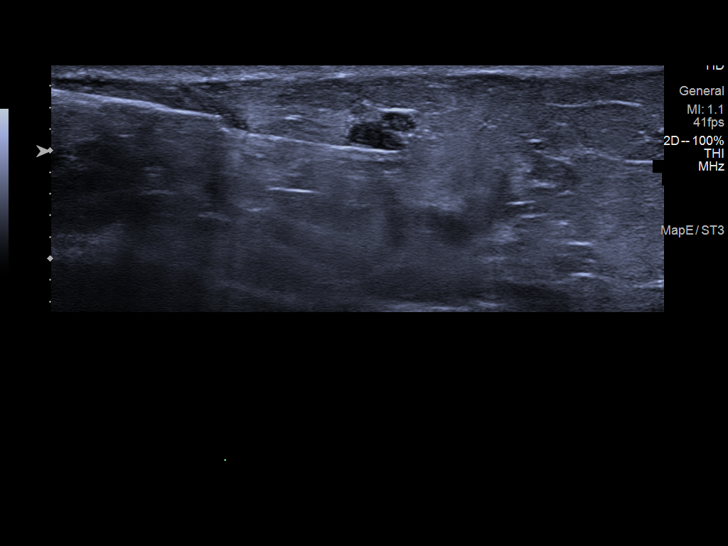
[im 13/23]
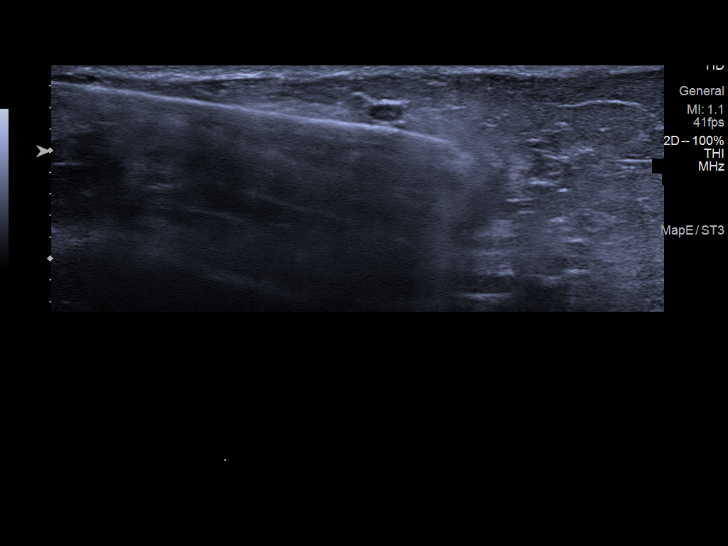
[im 15/23]
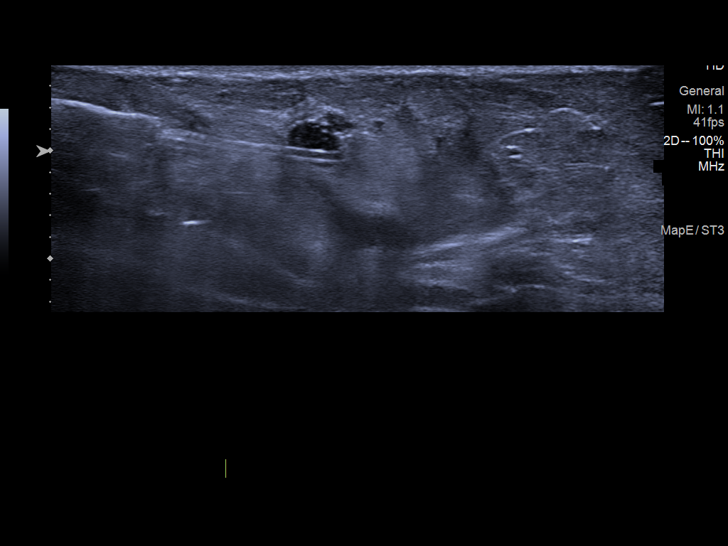
[im 17/23]
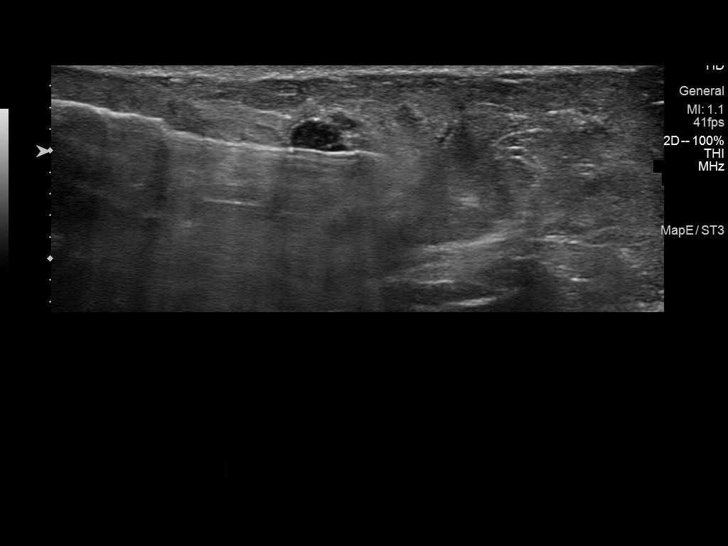
[im 19/23]
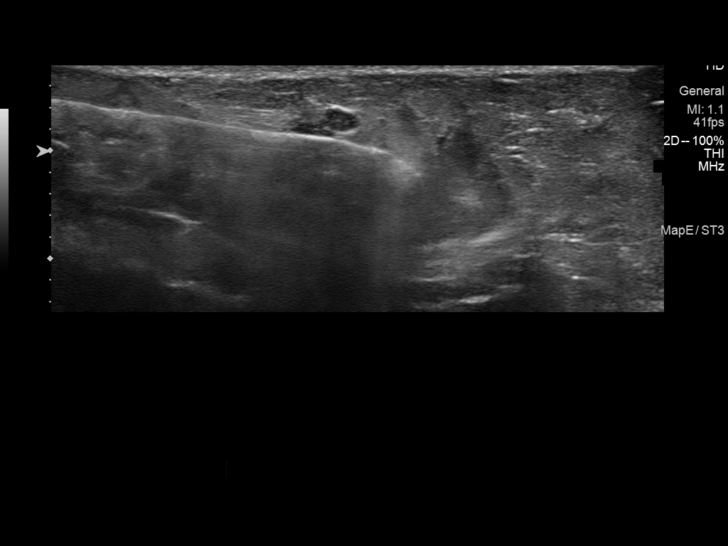
[im 21/23]
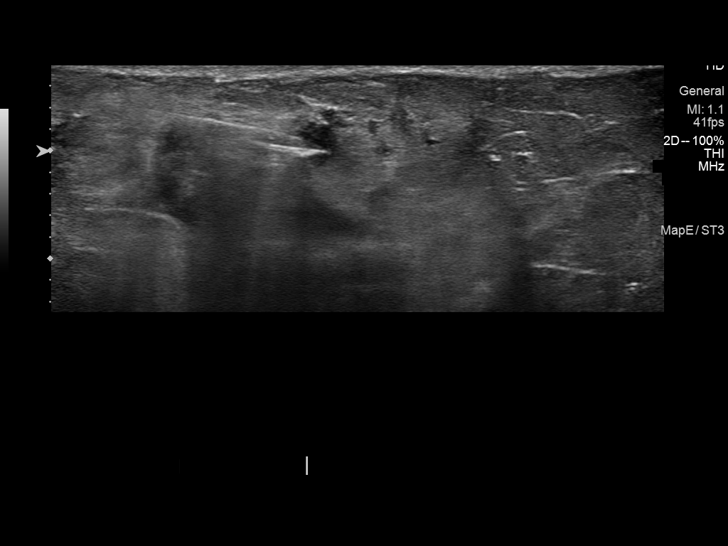
[im 23/23]
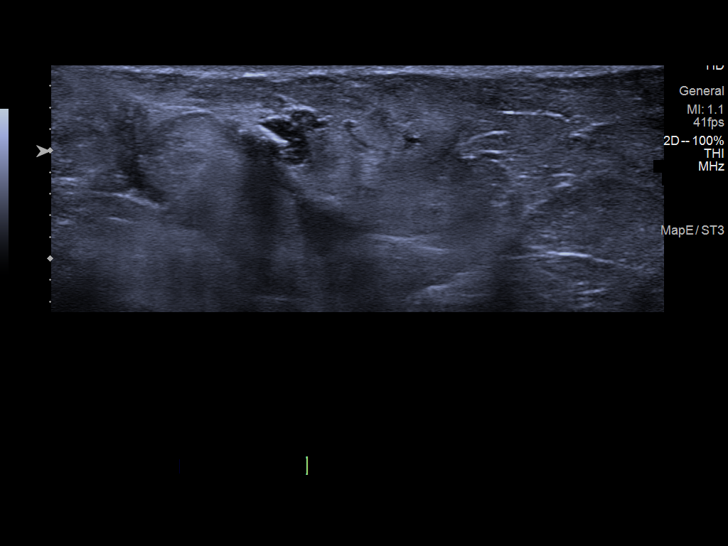

[12 of 23 positions shown; findings below may reference images not displayed]



Lesion quadrant: Lower outer

Using sterile technique and 1% Lidocaine as local anesthetic, under
direct ultrasound visualization, a 14 gauge Nila device was
used to perform biopsy of the mass in the right breast at the 8
o'clock position using a lateral to medial approach. At the
conclusion of the procedure a ribbon shaped tissue marker clip was
deployed into the biopsy cavity. Follow up 2 view mammogram was
performed and dictated separately.
IMPRESSION: Ultrasound guided biopsy of the mass in the right breast at the 8
o'clock position. No apparent complications.

ADDENDUM:
Pathology revealed Benign lymph node of the Right breast, [DATE]. This
was found to be concordant by Dr. Tolik Mereke.

Pathology results were discussed with the patient by telephone. The
patient reported doing well after the biopsy with tenderness at the
site. Post biopsy instructions and care were reviewed and questions
were answered. The patient was encouraged to call The [REDACTED]

The patient was instructed to return for annual screening
mammography at [facility] in [place]].

Pathology results reported by Igbalajobi Mamud, RN on 06/26/2019.



Lesion quadrant: Lower outer

Using sterile technique and 1% Lidocaine as local anesthetic, under
direct ultrasound visualization, a 14 gauge Nila device was
used to perform biopsy of the mass in the right breast at the 8
o'clock position using a lateral to medial approach. At the
conclusion of the procedure a ribbon shaped tissue marker clip was
deployed into the biopsy cavity. Follow up 2 view mammogram was
performed and dictated separately.
IMPRESSION: Ultrasound guided biopsy of the mass in the right breast at the 8
o'clock position. No apparent complications.

## 2021-11-11 ENCOUNTER — Ambulatory Visit (INDEPENDENT_AMBULATORY_CARE_PROVIDER_SITE_OTHER): Payer: 59 | Admitting: Orthopaedic Surgery

## 2021-11-11 ENCOUNTER — Ambulatory Visit (INDEPENDENT_AMBULATORY_CARE_PROVIDER_SITE_OTHER): Payer: 59

## 2021-11-11 VITALS — Ht 62.75 in | Wt 310.8 lb

## 2021-11-11 DIAGNOSIS — M1712 Unilateral primary osteoarthritis, left knee: Secondary | ICD-10-CM | POA: Insufficient documentation

## 2021-11-11 DIAGNOSIS — M25562 Pain in left knee: Secondary | ICD-10-CM

## 2021-11-11 DIAGNOSIS — G8929 Other chronic pain: Secondary | ICD-10-CM | POA: Diagnosis not present

## 2021-11-11 MED ORDER — BUPIVACAINE HCL 0.5 % IJ SOLN
3.0000 mL | INTRAMUSCULAR | Status: AC | PRN
  Administered 2021-11-11: 3 mL via INTRA_ARTICULAR

## 2021-11-11 MED ORDER — LIDOCAINE HCL 1 % IJ SOLN
0.5000 mL | INTRAMUSCULAR | Status: AC | PRN
  Administered 2021-11-11: .5 mL

## 2021-11-11 MED ORDER — METHYLPREDNISOLONE ACETATE 40 MG/ML IJ SUSP
40.0000 mg | INTRAMUSCULAR | Status: AC | PRN
  Administered 2021-11-11: 40 mg via INTRA_ARTICULAR

## 2021-12-06 ENCOUNTER — Telehealth: Payer: Self-pay

## 2021-12-06 NOTE — Telephone Encounter (Signed)
patient called and is asking for documentation of why her BMI has to be below 40 for her to have a knee replacement.  She states she had one 5 years ago and hasn't had any problems.  Why is this a rule now?
